# Patient Record
Sex: Female | Born: 1965 | Race: White | Hispanic: No | Marital: Married | Smoking: Never smoker
Health system: Southern US, Community
[De-identification: ages and names within clinical notes are randomized; demographics above are authoritative.]

## PROBLEM LIST (undated history)

## (undated) DIAGNOSIS — G5603 Carpal tunnel syndrome, bilateral upper limbs: Secondary | ICD-10-CM

## (undated) DIAGNOSIS — I499 Cardiac arrhythmia, unspecified: Secondary | ICD-10-CM

## (undated) HISTORY — PX: OTHER SURGICAL HISTORY: SHX169

## (undated) HISTORY — DX: Cardiac arrhythmia, unspecified: I49.9

## (undated) HISTORY — PX: ABDOMINAL HYSTERECTOMY: SHX81

---
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
## 2006-03-15 ENCOUNTER — Encounter: Admission: RE | Admit: 2006-03-15 | Discharge: 2006-03-15 | Payer: Self-pay | Admitting: Obstetrics and Gynecology

## 2006-03-31 ENCOUNTER — Encounter: Admission: RE | Admit: 2006-03-31 | Discharge: 2006-03-31 | Payer: Self-pay | Admitting: Obstetrics and Gynecology

## 2007-04-12 ENCOUNTER — Encounter: Admission: RE | Admit: 2007-04-12 | Discharge: 2007-04-12 | Payer: Self-pay | Admitting: Obstetrics & Gynecology

## 2007-11-03 ENCOUNTER — Ambulatory Visit (HOSPITAL_COMMUNITY): Admission: RE | Admit: 2007-11-03 | Discharge: 2007-11-03 | Payer: Self-pay | Admitting: Obstetrics & Gynecology

## 2008-02-22 ENCOUNTER — Ambulatory Visit: Payer: Self-pay | Admitting: Cardiology

## 2009-02-12 ENCOUNTER — Encounter: Admission: RE | Admit: 2009-02-12 | Discharge: 2009-02-12 | Payer: Self-pay | Admitting: Obstetrics & Gynecology

## 2009-02-18 ENCOUNTER — Encounter: Payer: Self-pay | Admitting: Obstetrics & Gynecology

## 2009-02-18 ENCOUNTER — Encounter: Admission: RE | Admit: 2009-02-18 | Discharge: 2009-02-18 | Payer: Self-pay | Admitting: Obstetrics & Gynecology

## 2009-03-17 ENCOUNTER — Ambulatory Visit (HOSPITAL_COMMUNITY): Admission: RE | Admit: 2009-03-17 | Discharge: 2009-03-17 | Payer: Self-pay | Admitting: Obstetrics & Gynecology

## 2009-05-17 HISTORY — PX: BREAST SURGERY: SHX581

## 2010-06-07 ENCOUNTER — Encounter: Payer: Self-pay | Admitting: Obstetrics and Gynecology

## 2011-04-16 ENCOUNTER — Other Ambulatory Visit: Payer: Self-pay | Admitting: Family Medicine

## 2011-04-16 ENCOUNTER — Other Ambulatory Visit: Payer: Self-pay | Admitting: Obstetrics & Gynecology

## 2011-04-16 DIAGNOSIS — Z1231 Encounter for screening mammogram for malignant neoplasm of breast: Secondary | ICD-10-CM

## 2011-05-17 ENCOUNTER — Ambulatory Visit
Admission: RE | Admit: 2011-05-17 | Discharge: 2011-05-17 | Disposition: A | Discharge status: Home / Self Care | Payer: BC Managed Care – PPO | Source: Ambulatory Visit | Attending: Family Medicine | Admitting: Family Medicine

## 2011-05-17 DIAGNOSIS — Z1231 Encounter for screening mammogram for malignant neoplasm of breast: Secondary | ICD-10-CM

## 2011-12-14 ENCOUNTER — Other Ambulatory Visit: Payer: Self-pay | Admitting: Adult Health

## 2011-12-14 DIAGNOSIS — E049 Nontoxic goiter, unspecified: Secondary | ICD-10-CM

## 2011-12-15 ENCOUNTER — Ambulatory Visit (HOSPITAL_COMMUNITY)
Admission: RE | Admit: 2011-12-15 | Discharge: 2011-12-15 | Disposition: A | Discharge status: Home / Self Care | Payer: BC Managed Care – PPO | Source: Ambulatory Visit | Attending: Adult Health | Admitting: Adult Health

## 2011-12-15 DIAGNOSIS — E049 Nontoxic goiter, unspecified: Secondary | ICD-10-CM | POA: Insufficient documentation

## 2012-09-20 ENCOUNTER — Other Ambulatory Visit: Payer: Self-pay

## 2012-09-20 DIAGNOSIS — Z1231 Encounter for screening mammogram for malignant neoplasm of breast: Secondary | ICD-10-CM

## 2012-10-31 ENCOUNTER — Ambulatory Visit
Admission: RE | Admit: 2012-10-31 | Discharge: 2012-10-31 | Disposition: A | Discharge status: Home / Self Care | Payer: BC Managed Care – PPO | Source: Ambulatory Visit

## 2012-10-31 DIAGNOSIS — Z1231 Encounter for screening mammogram for malignant neoplasm of breast: Secondary | ICD-10-CM

## 2012-12-14 ENCOUNTER — Ambulatory Visit (INDEPENDENT_AMBULATORY_CARE_PROVIDER_SITE_OTHER): Payer: BC Managed Care – PPO | Admitting: Adult Health

## 2012-12-14 ENCOUNTER — Encounter: Payer: Self-pay | Admitting: Adult Health

## 2012-12-14 VITALS — BP 128/86 | HR 76 | Ht 65.5 in | Wt 183.0 lb

## 2012-12-14 DIAGNOSIS — Z1212 Encounter for screening for malignant neoplasm of rectum: Secondary | ICD-10-CM

## 2012-12-14 DIAGNOSIS — I499 Cardiac arrhythmia, unspecified: Secondary | ICD-10-CM

## 2012-12-14 DIAGNOSIS — Z01419 Encounter for gynecological examination (general) (routine) without abnormal findings: Secondary | ICD-10-CM

## 2012-12-14 LAB — HEMOCCULT GUIAC POC 1CARD (OFFICE)

## 2012-12-14 MED ORDER — HYDROXYZINE HCL 25 MG PO TABS: ORAL_TABLET | ORAL | Status: DC

## 2012-12-14 NOTE — Progress Notes (Signed)
Patient ID: Zoe Henderson, female   DOB: August 13, 1965, 47 y.o.   MRN: [id number] History of Present Illness: Zoe Henderson is a 46 year old white female married in for a physical.   Current Medications, Allergies, Past Medical History, Past Surgical History, Family History and Social History were reviewed in [employer] electronic medical record.     Review of Systems: Patient denies any daily headaches, blurred vision, shortness of breath, chest pain, abdominal pain, problems with bowel movements, urination, or intercourse. No join pains or mood changes, but school is getting ready to start and sometimes she is anxious. She sometimes has hesitation with voiding and she has had irregular heart beat, that is better.She does not take any meds for it.   Physical Exam:BP 128/86  Pulse 76  Ht 5' 5.5" (1.664 m)  Wt 183 lb (83.008 kg)  BMI 29.98 kg/m2 General:  Well developed, well nourished, no acute distress Skin:  Warm and dry Neck:  Midline trachea, normal thyroid Lungs; Clear to auscultation bilaterally Breast:  No dominant palpable mass, retraction, or nipple discharge Cardiovascular: Regular rate and rhythm Abdomen:  Soft, non tender, no hepatosplenomegaly Pelvic:  External genitalia is normal in appearance.  The vagina is normal in appearance.The cervix and uterus are absent.  No  adnexal masses or tenderness noted. Rectal: Good sphincter tone, no polyps, or hemorrhoids felt.  Hemoccult negative. Extremities:  No swelling or varicosities noted Psych:  Alert and cooperative, seems happy   Impression: Yearly gyn exam-no pap Anxiety  History of irregular heart beat    Plan: Physical in 1 year Mammogram yearly  Colonoscopy at 50 Rx Vistaril 25 mg #30 1 bid prn with 1 refill

## 2012-12-14 NOTE — Patient Instructions (Addendum)
Physical in 1 year Mammogram at yearly Colonoscopy at 68

## 2013-12-17 ENCOUNTER — Other Ambulatory Visit: Payer: Self-pay

## 2013-12-17 DIAGNOSIS — Z1231 Encounter for screening mammogram for malignant neoplasm of breast: Secondary | ICD-10-CM

## 2013-12-24 ENCOUNTER — Ambulatory Visit: Payer: BC Managed Care – PPO

## 2013-12-25 ENCOUNTER — Encounter (INDEPENDENT_AMBULATORY_CARE_PROVIDER_SITE_OTHER): Payer: Self-pay

## 2013-12-25 ENCOUNTER — Ambulatory Visit
Admission: RE | Admit: 2013-12-25 | Discharge: 2013-12-25 | Disposition: A | Discharge status: Home / Self Care | Payer: BC Managed Care – PPO | Source: Ambulatory Visit

## 2013-12-25 DIAGNOSIS — Z1231 Encounter for screening mammogram for malignant neoplasm of breast: Secondary | ICD-10-CM

## 2014-03-18 ENCOUNTER — Encounter: Payer: Self-pay | Admitting: Adult Health

## 2015-03-18 ENCOUNTER — Other Ambulatory Visit: Payer: Self-pay

## 2015-03-18 DIAGNOSIS — Z1231 Encounter for screening mammogram for malignant neoplasm of breast: Secondary | ICD-10-CM

## 2015-05-02 ENCOUNTER — Ambulatory Visit
Admission: RE | Admit: 2015-05-02 | Discharge: 2015-05-02 | Disposition: A | Discharge status: Home / Self Care | Payer: BC Managed Care – PPO | Source: Ambulatory Visit

## 2015-05-02 DIAGNOSIS — Z1231 Encounter for screening mammogram for malignant neoplasm of breast: Secondary | ICD-10-CM

## 2017-05-23 ENCOUNTER — Other Ambulatory Visit: Payer: Self-pay | Admitting: Family Medicine

## 2017-05-23 DIAGNOSIS — Z1231 Encounter for screening mammogram for malignant neoplasm of breast: Secondary | ICD-10-CM

## 2017-05-31 ENCOUNTER — Ambulatory Visit (INDEPENDENT_AMBULATORY_CARE_PROVIDER_SITE_OTHER): Payer: Self-pay

## 2017-05-31 DIAGNOSIS — Z1211 Encounter for screening for malignant neoplasm of colon: Secondary | ICD-10-CM

## 2017-05-31 MED ORDER — PEG 3350-KCL-NA BICARB-NACL 420 G PO SOLR: 4000.0000 mL | ORAL | 0 refills | Status: DC

## 2017-05-31 NOTE — Progress Notes (Signed)
Ok to schedule.

## 2017-05-31 NOTE — Patient Instructions (Signed)
Zoe Henderson   08-09-1965 MRN: [id number]    Procedure Date: 06/24/17 Time to register: 11:00 Place to register: [facility] Procedure Time: 12:00 Scheduled provider: Dr.Fields  PREPARATION FOR COLONOSCOPY WITH TRI-LYTE SPLIT PREP  Please notify us immediately if you are diabetic, take iron supplements, or if you are on Coumadin or any other blood thinners.   Please hold the following medications: none  You will need to purchase 1 fleet enema and 1 box of Bisacodyl 68m tablets.   2 DAYS BEFORE PROCEDURE:  DATE: 06/22/17   DAY: Wednesday Begin clear liquid diet AFTER your lunch meal. NO SOLID FOODS after this point.  1 DAY BEFORE PROCEDURE:  DATE: 06/23/17   DAY: Thursday Continue clear liquids the entire day - NO SOLID FOOD.   Diabetic medications adjustments for today: none  At 2:00 pm:  Take 2 Bisacodyl tablets.   At 4:00pm:  Start drinking your solution. Make sure you mix well per instructions on the bottle. Try to drink 1 (one) 8 ounce glass every 10-15 minutes until you have consumed HALF the jug. You should complete by 6:00pm.You must keep the left over solution refrigerated until completed next day.  Continue clear liquids. You must drink plenty of clear liquids to prevent dehyration and kidney failure. Nothing to eat or drink after midnight.  EXCEPTION: If you take medications for your heart, blood pressure or breathing, you may take these medications with a small amount of clear liquid.    DAY OF PROCEDURE:   DATE: 06/24/17  DAY: Friday  Diabetic medications adjustments for today: none  Five hours before your procedure time @ 7:00am:  Finish remaining amout of bowel prep, drinking 1 (one) 8 ounce glass every 10-15 minutes until complete. You have two hours to consume remaining prep.   Three hours before your procedure time _0 :00am:  Nothing by mouth.   At least one hour before going to the hospital:  Give yourself one Fleet enema. You may take your morning  medications with sip of water unless we have instructed otherwise.      Please see below for Dietary Information.  CLEAR LIQUIDS INCLUDE:  Water Jello (NOT red in color)   Ice Popsicles (NOT red in color)   Tea (sugar ok, no milk/cream) Powdered fruit flavored drinks  Coffee (sugar ok, no milk/cream) Gatorade/ Lemonade/ Kool-Aid  (NOT red in color)   Juice: apple, white grape, white cranberry Soft drinks  Clear bullion, consomme, broth (fat free beef/chicken/vegetable)  Carbonated beverages (any kind)  Strained chicken noodle soup Hard Candy   Remember: Clear liquids are liquids that will allow you to see your fingers on the other side of a clear glass. Be sure liquids are NOT red in color, and not cloudy, but CLEAR.  DO NOT EAT OR DRINK ANY OF THE FOLLOWING:  Dairy products of any kind   Cranberry juice Tomato juice / V8 juice   Grapefruit juice Orange juice     Red grape juice  Do not eat any solid foods, including such foods as: cereal, oatmeal, yogurt, fruits, vegetables, creamed soups, eggs, bread, crackers, pureed foods in a blender, etc.   HELPFUL HINTS FOR DRINKING PREP SOLUTION:   Make sure prep is extremely cold. Mix and refrigerate the the morning of the prep. You may also put in the freezer.   You may try mixing some Crystal Light or Country Time Lemonade if you prefer. Mix in small amounts; add more if necessary.  Try drinking  through a straw  Rinse mouth with water or a mouthwash between glasses, to remove after-taste.  Try sipping on a cold beverage /ice/ popsicles between glasses of prep.  Place a piece of sugar-free hard candy in mouth between glasses.  If you become nauseated, try consuming smaller amounts, or stretch out the time between glasses. Stop for 30-60 minutes, then slowly start back drinking.        OTHER INSTRUCTIONS  You will need a responsible adult at least 52 years of age to accompany you and drive you home. This person must remain  in the waiting room during your procedure. The hospital will cancel your procedure if you do not have a responsible adult with you.   1. Wear loose fitting clothing that is easily removed. 2. Leave jewelry and other valuables at home.  3. Remove all body piercing jewelry and leave at home. 4. Total time from sign-in until discharge is approximately 2-3 hours. 5. You should go home directly after your procedure and rest. You can resume normal activities the day after your procedure. 6. The day of your procedure you should not:  Drive  Make legal decisions  Operate machinery  Drink alcohol  Return to work   You may call the office (Dept: [phone]) before 5:00pm, or page the doctor on call [phone]) after 5:00pm, for further instructions, if necessary.   Insurance Information YOU WILL NEED TO CHECK WITH YOUR INSURANCE COMPANY FOR THE BENEFITS OF COVERAGE YOU HAVE FOR THIS PROCEDURE.  UNFORTUNATELY, NOT ALL INSURANCE COMPANIES HAVE BENEFITS TO COVER ALL OR PART OF THESE TYPES OF PROCEDURES.  IT IS YOUR RESPONSIBILITY TO CHECK YOUR BENEFITS, HOWEVER, WE WILL BE GLAD TO ASSIST YOU WITH ANY CODES YOUR INSURANCE COMPANY MAY NEED.    PLEASE NOTE THAT MOST INSURANCE COMPANIES WILL NOT COVER A SCREENING COLONOSCOPY FOR PEOPLE UNDER THE AGE OF 50  IF YOU HAVE BCBS INSURANCE, YOU MAY HAVE BENEFITS FOR A SCREENING COLONOSCOPY BUT IF POLYPS ARE FOUND THE DIAGNOSIS WILL CHANGE AND THEN YOU MAY HAVE A DEDUCTIBLE THAT WILL NEED TO BE MET. SO PLEASE MAKE SURE YOU CHECK YOUR BENEFITS FOR A SCREENING COLONOSCOPY AS WELL AS A DIAGNOSTIC COLONOSCOPY.

## 2017-05-31 NOTE — Progress Notes (Signed)
Gastroenterology Pre-Procedure Review  Request Date:05/31/17 Requesting Physician: Dr.Daniels  PATIENT REVIEW QUESTIONS: The patient responded to the following health history questions as indicated:    1. Diabetes Melitis: no 2. Joint replacements in the past 12 months: no 3. Major health problems in the past 3 months: no 4. Has an artificial valve or MVP: no 5. Has a defibrillator: no 6. Has been advised in past to take antibiotics in advance of a procedure like teeth cleaning: no 7. Family history of colon cancer: no  8. Alcohol Use: no 9. History of sleep apnea: no  10. History of coronary artery or other vascular stents placed within the last 12 months: no 11. History of any prior anesthesia complications: no    MEDICATIONS & ALLERGIES:    Patient reports the following regarding taking any blood thinners:   Plavix? no Aspirin? no Coumadin? no Brilinta? no Xarelto? no Eliquis? no Pradaxa? no Savaysa? no Effient? no  Patient confirms/reports the following medications:  Current Outpatient Medications  Medication Sig Dispense Refill  . FLUoxetine (PROZAC) 20 MG capsule Take 10 mg by mouth daily.    Marland Kitchen loratadine (CLARITIN) 10 MG tablet Take 10 mg by mouth daily.     No current facility-administered medications for this visit.     Patient confirms/reports the following allergies:  No Known Allergies  No orders of the defined types were placed in this encounter.   AUTHORIZATION INFORMATION Primary Insurance: bcbs [place],  [place] #[id number] Pre-Cert / Josem Kaufmann required: no   SCHEDULE INFORMATION: Procedure has been scheduled as follows:  Date: 06/24/17, Time: 12:00 Location: [facility]  This Gastroenterology Pre-Precedure Review Form is being routed to the following provider(s): Neil Crouch PA- C

## 2017-06-08 ENCOUNTER — Ambulatory Visit
Admission: RE | Admit: 2017-06-08 | Discharge: 2017-06-08 | Disposition: A | Discharge status: Home / Self Care | Payer: BC Managed Care – PPO | Source: Ambulatory Visit | Attending: Family Medicine | Admitting: Family Medicine

## 2017-06-08 DIAGNOSIS — Z1231 Encounter for screening mammogram for malignant neoplasm of breast: Secondary | ICD-10-CM

## 2017-06-24 ENCOUNTER — Encounter (HOSPITAL_COMMUNITY): Payer: Self-pay | Admitting: Gastroenterology

## 2017-06-24 ENCOUNTER — Ambulatory Visit (HOSPITAL_COMMUNITY)
Admission: RE | Admit: 2017-06-24 | Discharge: 2017-06-24 | Disposition: A | Discharge status: Home / Self Care | Payer: BC Managed Care – PPO | Source: Ambulatory Visit | Attending: Gastroenterology | Admitting: Gastroenterology

## 2017-06-24 ENCOUNTER — Encounter (HOSPITAL_COMMUNITY)
Admission: RE | Disposition: A | Discharge status: Home / Self Care | Payer: Self-pay | Source: Ambulatory Visit | Attending: Gastroenterology

## 2017-06-24 DIAGNOSIS — D125 Benign neoplasm of sigmoid colon: Secondary | ICD-10-CM

## 2017-06-24 DIAGNOSIS — D123 Benign neoplasm of transverse colon: Secondary | ICD-10-CM

## 2017-06-24 DIAGNOSIS — Z79899 Other long term (current) drug therapy: Secondary | ICD-10-CM | POA: Diagnosis not present

## 2017-06-24 DIAGNOSIS — Z1211 Encounter for screening for malignant neoplasm of colon: Secondary | ICD-10-CM

## 2017-06-24 DIAGNOSIS — Q438 Other specified congenital malformations of intestine: Secondary | ICD-10-CM | POA: Insufficient documentation

## 2017-06-24 DIAGNOSIS — K644 Residual hemorrhoidal skin tags: Secondary | ICD-10-CM | POA: Diagnosis not present

## 2017-06-24 HISTORY — PX: COLONOSCOPY: SHX5424

## 2017-06-24 SURGERY — COLONOSCOPY
Anesthesia: Moderate Sedation

## 2017-06-24 MED ORDER — MIDAZOLAM HCL 5 MG/5ML IJ SOLN
INTRAMUSCULAR | Status: AC
  Filled 2017-06-24: qty 10

## 2017-06-24 MED ORDER — MEPERIDINE HCL 100 MG/ML IJ SOLN
INTRAMUSCULAR | Status: AC
  Filled 2017-06-24: qty 2

## 2017-06-24 MED ORDER — SODIUM CHLORIDE 0.9 % IV SOLN
INTRAVENOUS | Status: DC
  Administered 2017-06-24: 11:00:00 via INTRAVENOUS

## 2017-06-24 MED ORDER — STERILE WATER FOR IRRIGATION IR SOLN
Status: DC | PRN
  Administered 2017-06-24: 100 mL

## 2017-06-24 MED ORDER — MEPERIDINE HCL 100 MG/ML IJ SOLN
INTRAMUSCULAR | Status: DC | PRN
  Administered 2017-06-24: 50 mg via INTRAVENOUS
  Administered 2017-06-24: 25 mg via INTRAVENOUS

## 2017-06-24 MED ORDER — MIDAZOLAM HCL 5 MG/5ML IJ SOLN
INTRAMUSCULAR | Status: DC | PRN
  Administered 2017-06-24 (×2): 2 mg via INTRAVENOUS

## 2017-06-24 NOTE — H&P (Signed)
  Primary Care Physician:  Caryl Bis, MD Primary Gastroenterologist:  Dr. Oneida Alar  Pre-Procedure History & Physical: HPI:  Zoe Henderson is a 52 y.o. female here for [place].  Past Medical History:  Diagnosis Date  . Irregular heart beat     Past Surgical History:  Procedure Laterality Date  . ABDOMINAL HYSTERECTOMY    . BREAST SURGERY Right 2011   breast biopsy   . CESAREAN SECTION    . rso      Prior to Admission medications   Medication Sig Start Date End Date Taking? Authorizing Provider  FLUoxetine (PROZAC) 20 MG capsule Take 20 mg by mouth every evening.  02/03/17 08/01/17 Yes [provider]  ibuprofen (ADVIL,MOTRIN) 200 MG tablet Take 400 mg by mouth every 8 (eight) hours as needed (for pain.).   Yes [provider]  loratadine (CLARITIN) 10 MG tablet Take 10 mg by mouth daily.   Yes [provider]  polyethylene glycol-electrolytes (TRILYTE) 420 g solution Take 4,000 mLs by mouth as directed. 05/31/17   Mahala Menghini, PA-C    Allergies as of 05/31/2017  . (No Known Allergies)    Family History  Problem Relation Age of Onset  . Thyroid disease Mother   . Cancer Maternal Aunt        breast and thyroid  . Cancer Maternal Uncle        thyroid     Social History   Socioeconomic History  . Marital status: Married    Spouse name: Not on file  . Number of children: Not on file  . Years of education: Not on file  . Highest education level: Not on file  Social Needs  . Financial resource strain: Not on file  . Food insecurity - worry: Not on file  . Food insecurity - inability: Not on file  . Transportation needs - medical: Not on file  . Transportation needs - non-medical: Not on file  Occupational History  . Not on file  Tobacco Use  . Smoking status: Never Smoker  . Smokeless tobacco: Never Used  Substance and Sexual Activity  . Alcohol use: No    Comment: occ.  . Drug use: No  . Sexual activity: Yes   Birth control/protection: Surgical  Other Topics Concern  . Not on file  Social History Narrative  . Not on file    Review of Systems: See HPI, otherwise negative ROS   Physical Exam: There were no vitals taken for this visit. General:   Alert,  pleasant and cooperative in NAD Head:  Normocephalic and atraumatic. Neck:  Supple; Lungs:  Clear throughout to auscultation.    Heart:  Regular rate and rhythm. Abdomen:  Soft, nontender and nondistended. Normal bowel sounds, without guarding, and without rebound.   Neurologic:  Alert and  oriented x4;  grossly normal neurologically.  Impression/Plan:     SCREENING  Plan:  1. TCS TODAY DISCUSSED PROCEDURE, BENEFITS, & RISKS: < 1% chance of medication reaction, bleeding, perforation, or rupture of spleen/liver.

## 2017-06-24 NOTE — Discharge Instructions (Signed)
You had 2 polyps removed. Marland Kitchenone was larger than 1 cm. You have MODERTAE EXTERNAL internal hemorrhoids.   DRINK WATER TO KEEP YOUR URINE LIGHT YELLOW.  CONTINUE YOUR WEIGHT LOSS EFFORTS.  WHILE I DO NOT WANT TO ALARM YOU, YOUR BODY MASS INDEX IS OVER 30 WHICH MEANS YOU ARE OBESE. Obesity can activate cancer genes. OBESITY IS ASSOCIATED WITH AN INCREASED RISK FOR CIRRHOSIS AND ALL CANCERS, INCLUDING ESOPHAGEAL AND COLON CANCER. A WEIGHT OF 175 LBS   WILL GET YOUR BODY MASS INDEX(BMI) UNDER 30.   FOLLOW A HIGH FIBER DIET. AVOID ITEMS THAT CAUSE BLOATING & GAS. SEE INFO BELOW.  YOUR BIOPSY RESULTS WILL BE AVAILABLE IN MY CHART AFTER FEB 12  AND MY OFFICE WILL CONTACT YOU IN 10-14 DAYS WITH YOUR RESULTS.   Next colonoscopy in 3 years BECAUSE ONE POLYP WAS LARGER THAN 1 CM. NO CHICKEN BROTH WITH OIL DROPLETS BEFORE NEXT COLONOSCOPY.   Colonoscopy Care After Read the instructions outlined below and refer to this sheet in the next week. These discharge instructions provide you with general information on caring for yourself after you leave the hospital. While your treatment has been planned according to the most current medical practices available, unavoidable complications occasionally occur. If you have any problems or questions after discharge, call DR. FIELDS, [phone].  ACTIVITY  You may resume your regular activity, but move at a slower pace for the next 24 hours.   Take frequent rest periods for the next 24 hours.   Walking will help get rid of the air and reduce the bloated feeling in your belly (abdomen).   No driving for 24 hours (because of the medicine (anesthesia) used during the test).   You may shower.   Do not sign any important legal documents or operate any machinery for 24 hours (because of the anesthesia used during the test).    NUTRITION  Drink plenty of fluids.   You may resume your normal diet as instructed by your doctor.   Begin with a light meal and  progress to your normal diet. Heavy or fried foods are harder to digest and may make you feel sick to your stomach (nauseated).   Avoid alcoholic beverages for 24 hours or as instructed.    MEDICATIONS  You may resume your normal medications.   WHAT YOU CAN EXPECT TODAY  Some feelings of bloating in the abdomen.   Passage of more gas than usual.   Spotting of blood in your stool or on the toilet paper  .  IF YOU HAD POLYPS REMOVED DURING THE COLONOSCOPY:  Eat a soft diet IF YOU HAVE NAUSEA, BLOATING, ABDOMINAL PAIN, OR VOMITING.    FINDING OUT THE RESULTS OF YOUR TEST Not all test results are available during your visit. DR. Oneida Alar WILL CALL YOU WITHIN 14 DAYS OF YOUR PROCEDUE WITH YOUR RESULTS. Do not assume everything is normal if you have not heard from DR. FIELDS, CALL HER OFFICE AT [phone].  SEEK IMMEDIATE MEDICAL ATTENTION AND CALL THE OFFICE: [phone] IF:  You have more than a spotting of blood in your stool.   Your belly is swollen (abdominal distention).   You are nauseated or vomiting.   You have a temperature over 101F.   You have abdominal pain or discomfort that is severe or gets worse throughout the day.   High-Fiber Diet A high-fiber diet changes your normal diet to include more whole grains, legumes, fruits, and vegetables. Changes in the diet involve replacing refined carbohydrates  with unrefined foods. The calorie level of the diet is essentially unchanged. The Dietary Reference Intake (recommended amount) for adult males is 38 grams per day. For adult females, it is 25 grams per day. Pregnant and lactating women should consume 28 grams of fiber per day. Fiber is the intact part of a plant that is not broken down during digestion. Functional fiber is fiber that has been isolated from the plant to provide a beneficial effect in the body. PURPOSE  Increase stool bulk.   Ease and regulate bowel movements.   Lower cholesterol.   REDUCE RISK OF  COLON CANCER  INDICATIONS THAT YOU NEED MORE FIBER  Constipation and hemorrhoids.   Uncomplicated diverticulosis (intestine condition) and irritable bowel syndrome.   Weight management.   As a protective measure against hardening of the arteries (atherosclerosis), diabetes, and cancer.   GUIDELINES FOR INCREASING FIBER IN THE DIET  Start adding fiber to the diet slowly. A gradual increase of about 5 more grams (2 slices of whole-wheat bread, 2 servings of most fruits or vegetables, or 1 bowl of high-fiber cereal) per day is best. Too rapid an increase in fiber may result in constipation, flatulence, and bloating.   Drink enough water and fluids to keep your urine clear or pale yellow. Water, juice, or caffeine-free drinks are recommended. Not drinking enough fluid may cause constipation.   Eat a variety of high-fiber foods rather than one type of fiber.   Try to increase your intake of fiber through using high-fiber foods rather than fiber pills or supplements that contain small amounts of fiber.   The goal is to change the types of food eaten. Do not supplement your present diet with high-fiber foods, but replace foods in your present diet.   INCLUDE A VARIETY OF FIBER SOURCES  Replace refined and processed grains with whole grains, canned fruits with fresh fruits, and incorporate other fiber sources. White rice, white breads, and most bakery goods contain little or no fiber.   Brown whole-grain rice, buckwheat oats, and many fruits and vegetables are all good sources of fiber. These include: broccoli, Brussels sprouts, cabbage, cauliflower, beets, sweet potatoes, white potatoes (skin on), carrots, tomatoes, eggplant, squash, berries, fresh fruits, and dried fruits.   Cereals appear to be the richest source of fiber. Cereal fiber is found in whole grains and bran. Bran is the fiber-rich outer coat of cereal grain, which is largely removed in refining. In whole-grain cereals, the bran  remains. In breakfast cereals, the largest amount of fiber is found in those with "bran" in their names. The fiber content is sometimes indicated on the label.   You may need to include additional fruits and vegetables each day.   In baking, for 1 cup white flour, you may use the following substitutions:   1 cup whole-wheat flour minus 2 tablespoons.   1/2 cup white flour plus 1/2 cup whole-wheat flour.   Polyps, Colon  A polyp is extra tissue that grows inside your body. Colon polyps grow in the large intestine. The large intestine, also called the colon, is part of your digestive system. It is a long, hollow tube at the end of your digestive tract where your body makes and stores stool. Most polyps are not dangerous. They are benign. This means they are not cancerous. But over time, some types of polyps can turn into cancer. Polyps that are smaller than a pea are usually not harmful. But larger polyps could someday become or may already be  cancerous. To be safe, doctors remove all polyps and test them.   WHO GETS POLYPS? Anyone can get polyps, but certain people are more likely than others. You may have a greater chance of getting polyps if:  You are over 50.   You have had polyps before.   Someone in your family has had polyps.   Someone in your family has had cancer of the large intestine.   Find out if someone in your family has had polyps. You may also be more likely to get polyps if you:   Eat a lot of fatty foods   Smoke   Drink alcohol   Do not exercise  Eat too much   PREVENTION There is not one sure way to prevent polyps. You might be able to lower your risk of getting them if you:  Eat more fruits and vegetables and less fatty food.   Do not smoke.   Avoid alcohol.   Exercise every day.   Lose weight if you are overweight.   Eating more calcium and folate can also lower your risk of getting polyps. Some foods that are rich in calcium are milk, cheese, and  broccoli. Some foods that are rich in folate are chickpeas, kidney beans, and spinach.   Hemorrhoids Hemorrhoids are dilated (enlarged) veins around the rectum. Sometimes clots will form in the veins. This makes them swollen and painful. These are called thrombosed hemorrhoids. Causes of hemorrhoids include:  Constipation.   Straining to have a bowel movement.   HEAVY LIFTING  HOME CARE INSTRUCTIONS  Eat a well balanced diet and drink 6 to 8 glasses of water every day to avoid constipation. You may also use a bulk laxative.   Avoid straining to have bowel movements.   Keep anal area dry and clean.   Do not use a donut shaped pillow or sit on the toilet for long periods. This increases blood pooling and pain.   Move your bowels when your body has the urge; this will require less straining and will decrease pain and pressure.

## 2017-06-24 NOTE — Op Note (Signed)
[facility] Patient Name: Zoe Henderson Procedure Date: 06/24/2017 10:39 AM MRN: [id number] Date of Birth: 05/12/1966 Attending MD: Barney Drain MD, MD CSN: [id number] Age: 52 Admit Type: Outpatient Procedure:                Colonoscopy WITH COLD SNARE/SNARE CAUTERY                            POLYPECTOMY Indications:              Screening for colorectal malignant neoplasm Providers:                Barney Drain MD, MD, Rosina Lowenstein, RN, Aram Candela Referring MD:             Mitzie Na. Daniel MD, MD Medicines:                Meperidine 75 mg IV, Midazolam 5 mg IV Complications:            No immediate complications. Estimated Blood Loss:     Estimated blood loss was minimal. Procedure:                Pre-Anesthesia Assessment:                           - Prior to the procedure, a History and Physical                            was performed, and patient medications and                            allergies were reviewed. The patient's tolerance of                            previous anesthesia was also reviewed. The risks                            and benefits of the procedure and the sedation                            options and risks were discussed with the patient.                            All questions were answered, and informed consent                            was obtained. Prior Anticoagulants: The patient has                            taken ibuprofen, last dose was 7 days prior to                            procedure. ASA Grade Assessment: II - A patient                            with mild systemic disease. After reviewing the  risks and benefits, the patient was deemed in                            satisfactory condition to undergo the procedure.                            After obtaining informed consent, the colonoscope                            was passed under direct vision. Throughout the                            procedure, the  patient's blood pressure, pulse, and                            oxygen saturations were monitored continuously. The                            EC-[id number] ([id number]) scope was introduced through                            the anus and advanced to the the cecum, identified                            by appendiceal orifice and ileocecal valve. The                            colonoscopy was technically difficult and complex                            due to a tortuous colon. Successful completion of                            the procedure was aided by increasing the dose of                            sedation medication, straightening and shortening                            the scope to obtain bowel loop reduction and                            COLOWRAP. The quality of the bowel preparation was                            good. The ileocecal valve, appendiceal orifice, and                            rectum were photographed. The patient tolerated the                            procedure fairly well. Scope In: 11:41:56 AM Scope Out: 12:01:26 PM Scope Withdrawal Time: 0 hours 13 minutes 53 seconds  Total Procedure Duration: 0 hours 19 minutes 30 seconds  Findings:      A 5 mm polyp was found in the hepatic flexure. The polyp was sessile.       The polyp was removed with a cold snare. Resection and retrieval were       complete.      A 11 mm polyp was found in the sigmoid colon. The polyp was sessile. The       polyp was removed with a hot snare. Resection and retrieval were       complete.      The recto-sigmoid colon, sigmoid colon and descending colon were       significantly redundant.      External hemorrhoids were found during retroflexion. The hemorrhoids       were moderate. Impression:               - One 5 mm polyp at the hepatic flexure, removed                            with a cold snare. Resected and retrieved.                           - One 11 mm polyp in the sigmoid colon,  removed                            with a hot snare. Resected and retrieved.                           - Redundant LEFT colon WITH RESTRICTED MOBILITY.                           - External hemorrhoids. Moderate Sedation:      Moderate (conscious) sedation was administered by the endoscopy nurse       and supervised by the endoscopist. The following parameters were       monitored: oxygen saturation, heart rate, blood pressure, and response       to care. Total physician intraservice time was 29 minutes. Recommendation:           - Repeat colonoscopy in 3 years for surveillance                            WITH A PEDS SCOPE. NO CHICKEN BROTH WITH OIL.                           - High fiber diet.                           - Continue present medications.                           - Await pathology results.                           - Patient has a contact number available for  emergencies. The signs and symptoms of potential                            delayed complications were discussed with the                            patient. Return to normal activities tomorrow.                            Written discharge instructions were provided to the                            patient. Procedure Code(s):        --- Professional ---                           [phone], Colonoscopy, flexible; with removal of                            tumor(s), polyp(s), or other lesion(s) by snare                            technique                           99152, Moderate sedation services provided by the                            same physician or other qualified health care                            professional performing the diagnostic or                            therapeutic service that the sedation supports,                            requiring the presence of an independent trained                            observer to assist in the monitoring of the                             patient's level of consciousness and physiological                            status; initial 15 minutes of intraservice time,                            patient age 58 years or older                           [phone], Moderate sedation services; each additional  15 minutes intraservice time Diagnosis Code(s):        --- Professional ---                           Z12.11, Encounter for screening for malignant                            neoplasm of colon                           D12.3, Benign neoplasm of transverse colon (hepatic                            flexure or splenic flexure)                           D12.5, Benign neoplasm of sigmoid colon                           K64.4, Residual hemorrhoidal skin tags                           Q43.8, Other specified congenital malformations of                            intestine CPT copyright 2016 [employer]. All rights reserved. The codes documented in this report are preliminary and upon coder review may  be revised to meet current compliance requirements. Barney Drain, MD Barney Drain MD, MD 06/24/2017 12:13:35 PM This report has been signed electronically. Number of Addenda: 0

## 2017-06-28 ENCOUNTER — Telehealth: Payer: Self-pay | Admitting: Gastroenterology

## 2017-06-28 NOTE — Telephone Encounter (Signed)
Please call pt. She had TWO simple adenomas removed. ONE WAS LARGER THAN ONE CM.    DRINK WATER TO KEEP YOUR URINE LIGHT YELLOW.  CONTINUE YOUR WEIGHT LOSS EFFORTS.    FOLLOW A HIGH FIBER DIET. AVOID ITEMS THAT CAUSE BLOATING & GAS.   Next colonoscopy in 3 years BECAUSE ONE POLYP WAS LARGER THAN 1 CM. NO CHICKEN BROTH WITH OIL DROPLETS BEFORE NEXT COLONOSCOPY.  YOUR SISTERS, BROTHERS, CHILDREN, AND PARENTS NEED TO HAVE A COLONOSCOPY STARTING AT THE AGE OF 40.

## 2017-06-29 NOTE — Telephone Encounter (Signed)
ON RECALL  °

## 2017-06-30 NOTE — Telephone Encounter (Signed)
Pt is aware.  

## 2017-07-01 ENCOUNTER — Encounter (HOSPITAL_COMMUNITY): Payer: Self-pay | Admitting: Gastroenterology

## 2017-10-20 ENCOUNTER — Encounter: Payer: Self-pay | Admitting: Gastroenterology

## 2018-12-12 ENCOUNTER — Encounter: Payer: BC Managed Care – PPO | Admitting: Neurology

## 2018-12-27 ENCOUNTER — Encounter: Payer: Self-pay | Admitting: Neurology

## 2018-12-27 ENCOUNTER — Ambulatory Visit: Payer: BC Managed Care – PPO | Admitting: Neurology

## 2018-12-27 ENCOUNTER — Other Ambulatory Visit: Payer: Self-pay

## 2018-12-27 ENCOUNTER — Ambulatory Visit (INDEPENDENT_AMBULATORY_CARE_PROVIDER_SITE_OTHER): Payer: BC Managed Care – PPO | Admitting: Neurology

## 2018-12-27 DIAGNOSIS — G5603 Carpal tunnel syndrome, bilateral upper limbs: Secondary | ICD-10-CM | POA: Diagnosis not present

## 2018-12-27 HISTORY — DX: Carpal tunnel syndrome, bilateral upper limbs: G56.03

## 2018-12-27 NOTE — Procedures (Signed)
     HISTORY:  Zoe Henderson is a 53 year old patient with a 1 year history of numbness and discomfort in the hands bilaterally.  The patient has some achy sensations into the elbows as well.  She is being evaluated for possible neuropathy or a cervical radiculopathy.  NERVE CONDUCTION STUDIES:  Nerve conduction studies were performed on both upper extremities.  The distal motor latencies for the median nerves were prolonged bilaterally with normal motor amplitudes for these nerves seen.  The distal motor latencies and motor amplitudes for the ulnar nerves were normal bilaterally.  The nerve conduction velocities for the median and ulnar nerves were normal bilaterally.  The sensory latencies for the median nerves were prolonged bilaterally, normal for the ulnar nerves bilaterally.  The F-wave latencies for the ulnar nerves were within normal limits bilaterally.  EMG STUDIES:  EMG study was performed on the left upper extremity:  The first dorsal interosseous muscle reveals 2 to 4 K units with full recruitment. No fibrillations or positive waves were noted. The abductor pollicis brevis muscle reveals 2 to 4 K units with full recruitment. No fibrillations or positive waves were noted. The extensor indicis proprius muscle reveals 1 to 3 K units with full recruitment. No fibrillations or positive waves were noted. The pronator teres muscle reveals 2 to 3 K units with full recruitment. No fibrillations or positive waves were noted. The biceps muscle reveals 1 to 2 K units with full recruitment. No fibrillations or positive waves were noted. The triceps muscle reveals 2 to 4 K units with full recruitment. No fibrillations or positive waves were noted. The anterior deltoid muscle reveals 2 to 3 K units with full recruitment. No fibrillations or positive waves were noted. The cervical paraspinal muscles were tested at 2 levels. No abnormalities of insertional activity were seen at either level tested.  There was poor relaxation.   IMPRESSION:  Nerve conduction studies done on both upper extremities shows findings consistent with bilateral mild carpal tunnel syndrome.  EMG evaluation of the left upper extremity was unremarkable, no evidence of an overlying cervical radiculopathy was seen.  Jill Alexanders MD 12/27/2018 4:26 PM  [facility] [address] [place], [place] [address]  Phone [phone] Fax [phone]

## 2018-12-27 NOTE — Progress Notes (Signed)
Please refer to EMG and nerve conduction procedure note.  

## 2019-02-11 IMAGING — MG 2D DIGITAL SCREENING BILATERAL MAMMOGRAM WITH 3D TOMO WITH CAD
8 of 12 series · 8 of 28 positions shown · non-contrast
Comparison: Previous exam(s).

CLINICAL DATA: Screening.

EXAM:
2D DIGITAL SCREENING BILATERAL MAMMOGRAM WITH 3D TOMO WITH CAD

[R CC]
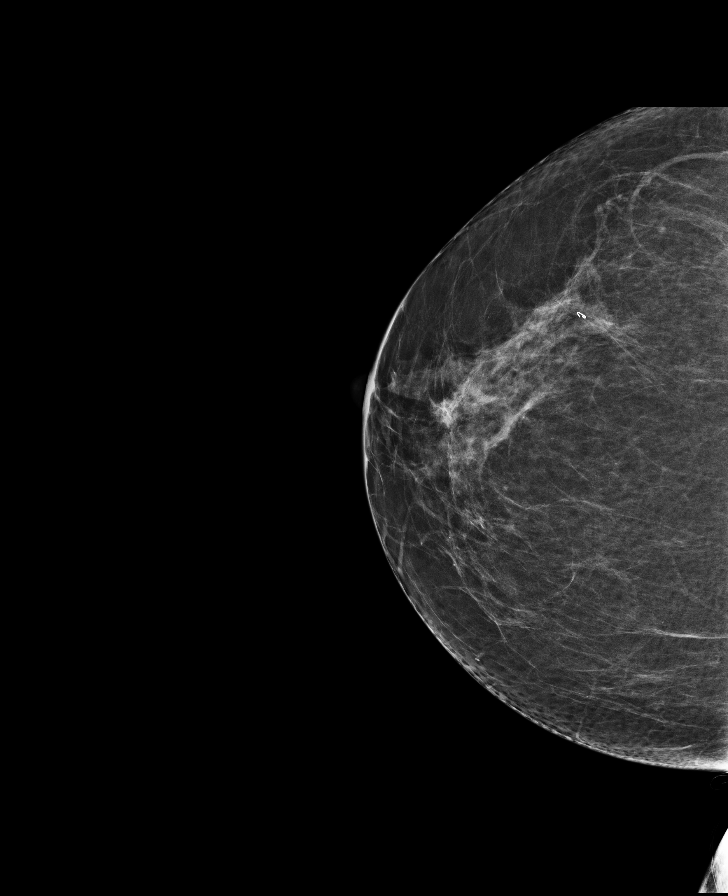

[L MLO synth-2D]
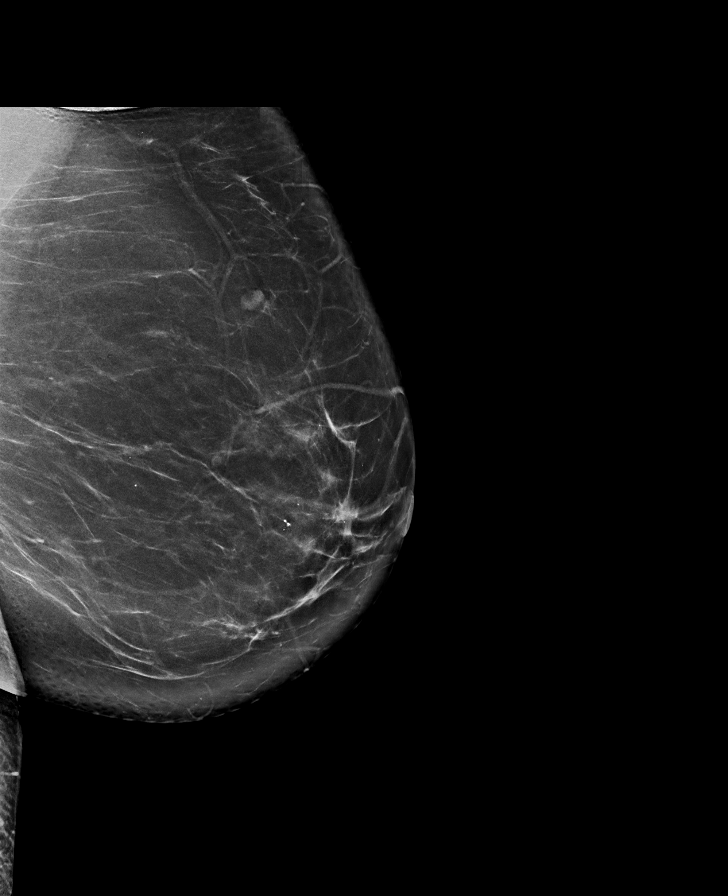

[R MLO synth-2D]
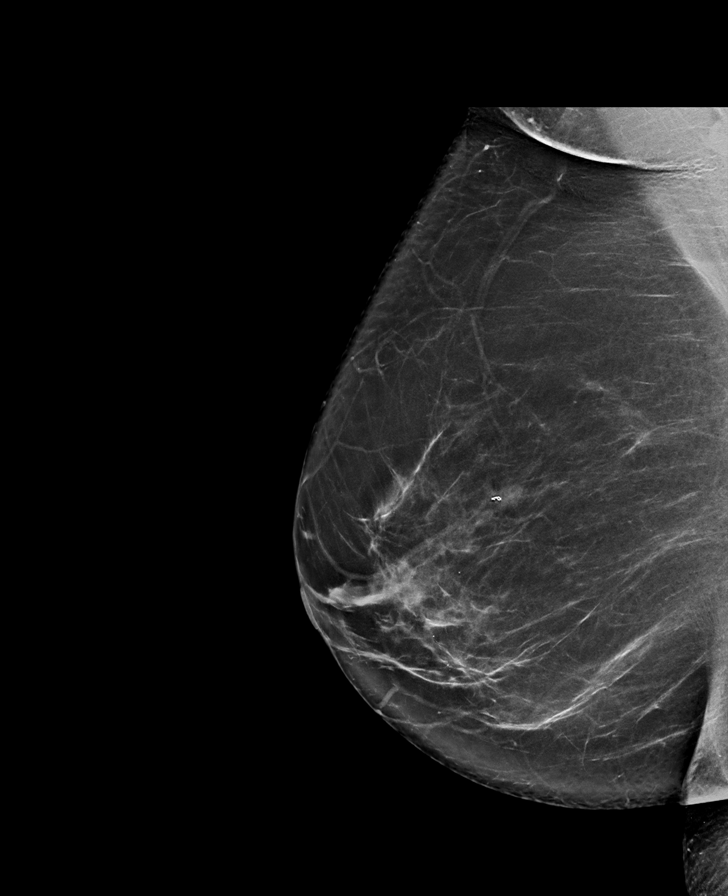

[R MLO]
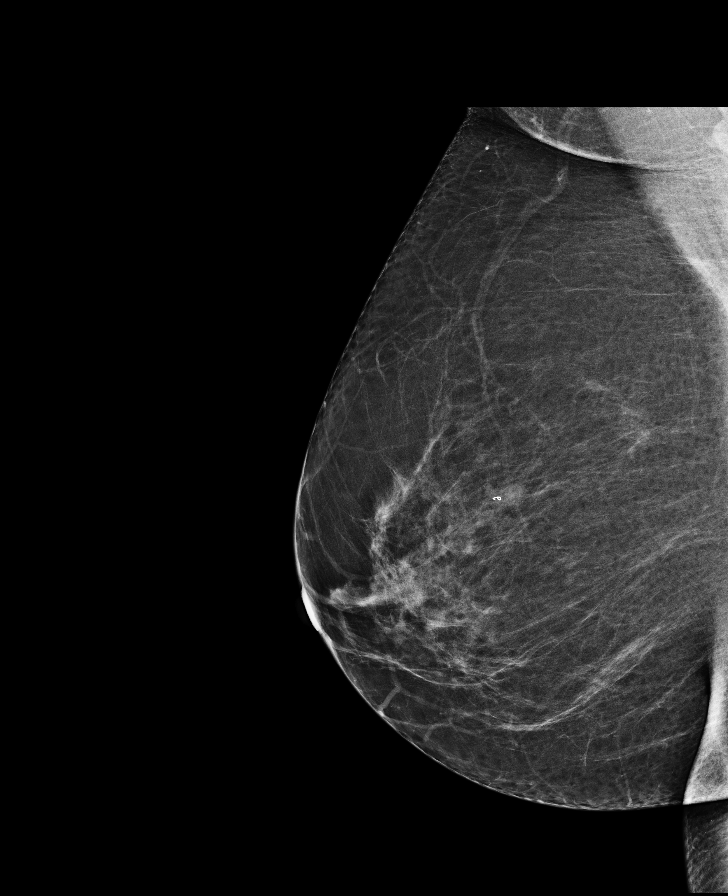

[L MLO]
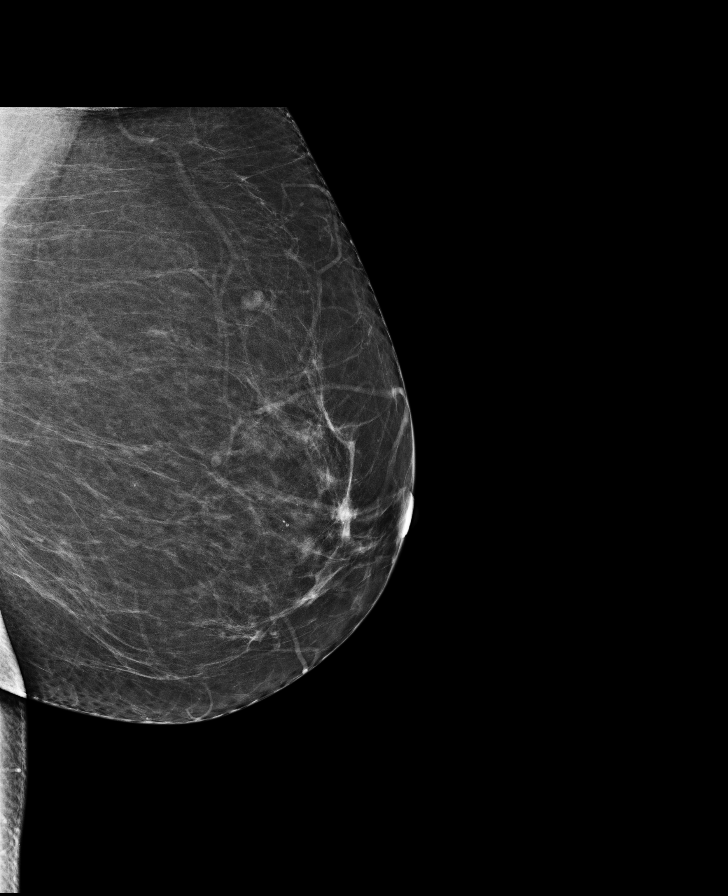

[R CC synth-2D]
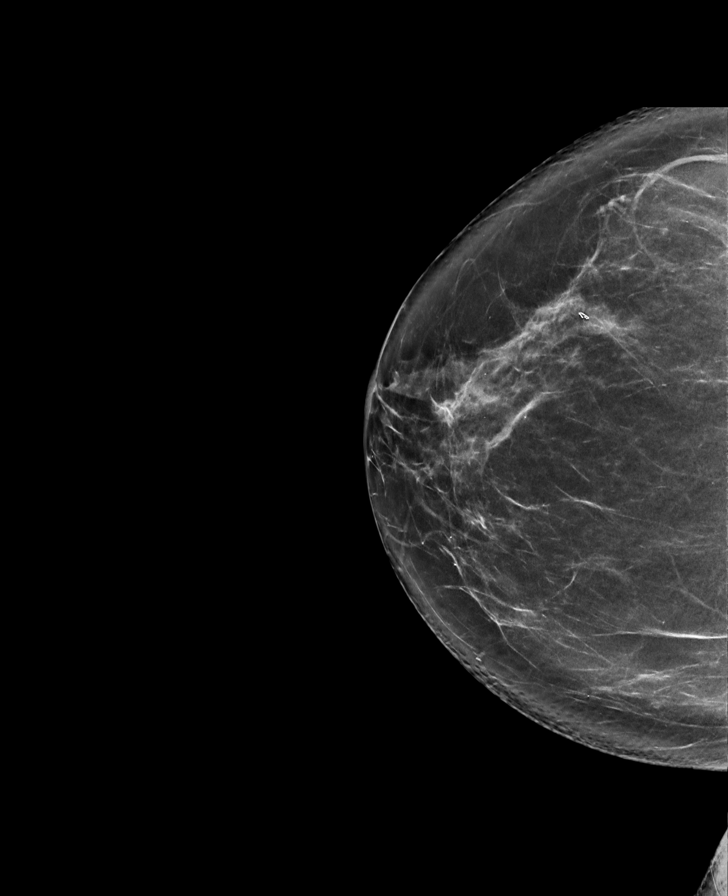

[L CC synth-2D]
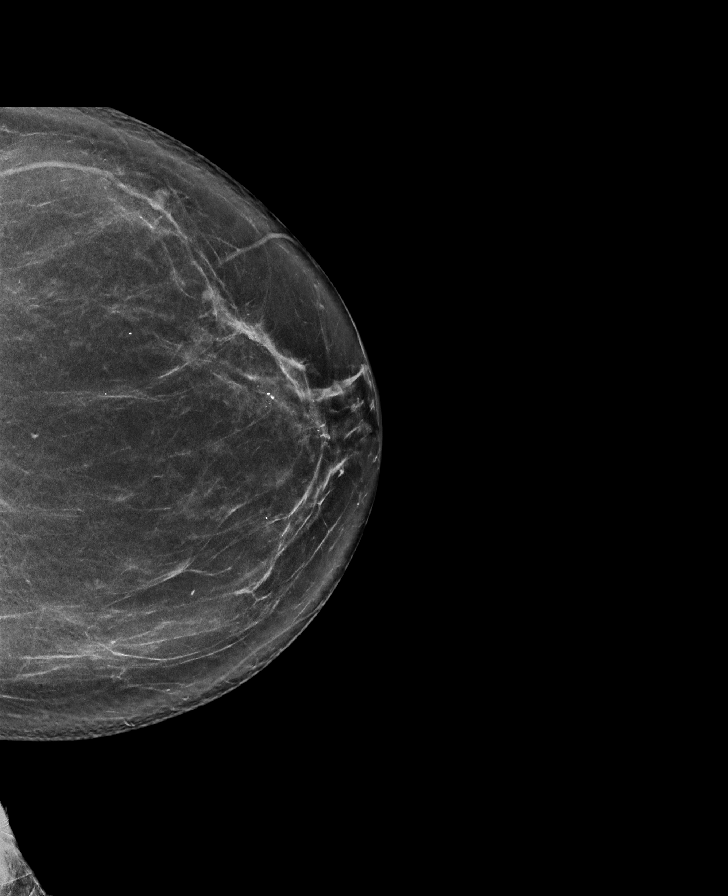

[L CC]
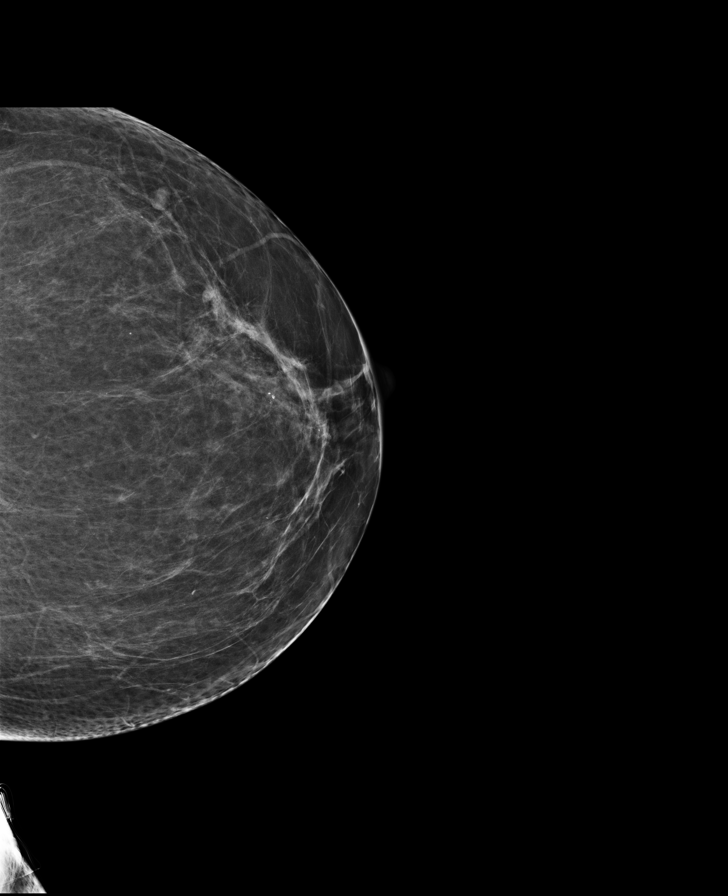

[8 of 28 positions shown; findings below may reference images not displayed]

ACR Breast Density Category b: There are scattered areas of
fibroglandular density.
FINDINGS: There are no findings suspicious for malignancy. Images were
processed with CAD.
IMPRESSION: No mammographic evidence of malignancy. A result letter of this
screening mammogram will be mailed directly to the patient.

RECOMMENDATION:
Screening mammogram in one year. (Code:GE-P-ZS0)

BI-RADS CATEGORY  1: Negative.

## 2020-06-12 ENCOUNTER — Encounter: Payer: Self-pay | Admitting: Internal Medicine

## 2020-11-25 ENCOUNTER — Other Ambulatory Visit: Payer: Self-pay | Admitting: Family Medicine

## 2020-11-25 DIAGNOSIS — Z1231 Encounter for screening mammogram for malignant neoplasm of breast: Secondary | ICD-10-CM

## 2020-12-01 ENCOUNTER — Ambulatory Visit
Admission: RE | Admit: 2020-12-01 | Discharge: 2020-12-01 | Disposition: A | Discharge status: Home / Self Care | Payer: BC Managed Care – PPO | Source: Ambulatory Visit | Attending: Family Medicine | Admitting: Family Medicine

## 2020-12-01 ENCOUNTER — Other Ambulatory Visit: Payer: Self-pay

## 2020-12-01 DIAGNOSIS — Z1231 Encounter for screening mammogram for malignant neoplasm of breast: Secondary | ICD-10-CM

## 2022-10-29 ENCOUNTER — Other Ambulatory Visit: Payer: Self-pay | Admitting: Family Medicine

## 2022-10-29 DIAGNOSIS — Z1231 Encounter for screening mammogram for malignant neoplasm of breast: Secondary | ICD-10-CM

## 2022-11-17 ENCOUNTER — Ambulatory Visit: Payer: BC Managed Care – PPO

## 2022-11-24 ENCOUNTER — Ambulatory Visit
Admission: RE | Admit: 2022-11-24 | Discharge: 2022-11-24 | Disposition: A | Discharge status: Home / Self Care | Payer: BC Managed Care – PPO | Source: Ambulatory Visit | Attending: Family Medicine | Admitting: Family Medicine

## 2022-11-24 DIAGNOSIS — Z1231 Encounter for screening mammogram for malignant neoplasm of breast: Secondary | ICD-10-CM

## 2023-03-08 ENCOUNTER — Encounter: Payer: Self-pay | Admitting: *Deleted

## 2023-09-01 ENCOUNTER — Ambulatory Visit: Payer: Self-pay | Admitting: Orthopaedic Surgery

## 2023-09-06 ENCOUNTER — Encounter: Payer: Self-pay | Admitting: *Deleted

## 2023-09-16 ENCOUNTER — Encounter: Payer: Self-pay | Admitting: Radiology

## 2024-01-06 ENCOUNTER — Encounter: Payer: Self-pay | Admitting: Radiology

## 2024-03-13 ENCOUNTER — Other Ambulatory Visit (HOSPITAL_BASED_OUTPATIENT_CLINIC_OR_DEPARTMENT_OTHER): Payer: Self-pay

## 2024-03-13 MED ORDER — DULOXETINE HCL 60 MG PO CPEP
60.0000 mg | ORAL_CAPSULE | Freq: Two times a day (BID) | ORAL | 1 refills | Status: AC | PRN
  Filled 2024-03-13: qty 180, 90d supply, fill #0

## 2024-03-19 ENCOUNTER — Encounter: Payer: Self-pay | Admitting: Radiology

## 2024-04-16 ENCOUNTER — Other Ambulatory Visit (HOSPITAL_BASED_OUTPATIENT_CLINIC_OR_DEPARTMENT_OTHER): Payer: Self-pay

## 2024-04-16 MED ORDER — AMPHETAMINE-DEXTROAMPHETAMINE 10 MG PO TABS
10.0000 mg | ORAL_TABLET | Freq: Two times a day (BID) | ORAL | 0 refills | Status: AC | PRN
  Filled 2024-04-16: qty 60, 30d supply, fill #0
